# Patient Record
Sex: Male | Born: 1975 | Race: White | Hispanic: No | Marital: Married | Smoking: Former smoker
Health system: Southern US, Community
[De-identification: ages and names within clinical notes are randomized; demographics above are authoritative.]

---

## 2006-07-15 ENCOUNTER — Inpatient Hospital Stay (HOSPITAL_COMMUNITY): Admission: RE | Admit: 2006-07-15 | Discharge: 2006-07-18 | Payer: Self-pay | Admitting: Psychiatry

## 2006-07-16 ENCOUNTER — Ambulatory Visit: Payer: Self-pay | Admitting: Psychiatry

## 2015-08-20 ENCOUNTER — Emergency Department
Admission: EM | Admit: 2015-08-20 | Discharge: 2015-08-20 | Disposition: A | Discharge status: Home / Self Care | Payer: BLUE CROSS/BLUE SHIELD | Source: Home / Self Care | Attending: Family Medicine | Admitting: Family Medicine

## 2015-08-20 ENCOUNTER — Emergency Department (INDEPENDENT_AMBULATORY_CARE_PROVIDER_SITE_OTHER): Payer: BLUE CROSS/BLUE SHIELD

## 2015-08-20 ENCOUNTER — Encounter: Payer: Self-pay | Admitting: Emergency Medicine

## 2015-08-20 DIAGNOSIS — M25571 Pain in right ankle and joints of right foot: Secondary | ICD-10-CM | POA: Diagnosis not present

## 2015-08-20 DIAGNOSIS — M7671 Peroneal tendinitis, right leg: Secondary | ICD-10-CM | POA: Diagnosis not present

## 2015-08-20 MED ORDER — MELOXICAM 15 MG PO TABS: 15.0000 mg | ORAL_TABLET | Freq: Every day | ORAL | Status: AC

## 2015-08-20 NOTE — ED Provider Notes (Signed)
CSN: [id number]     Arrival date & time 08/20/15  1255 History   First MD Initiated Contact with Patient 08/20/15 1408     Chief Complaint  Patient presents with  . Foot Pain      HPI Comments: Patient complains of 4 week history of pain/swelling in the lateral aspect of his right ankle.  He recalls no injury, and no change in physical activities.  He initially had some bruising below his right lateral ankle.  He has mild discomfort in his ankle when walking.  Patient is a 39 y.o. male presenting with lower extremity pain. The history is provided by the patient.  Foot Pain This is a new problem. Episode onset: 4 weeks ago. The problem occurs constantly. The problem has not changed since onset.The symptoms are aggravated by walking. Nothing relieves the symptoms. Treatments tried: ice pack. The treatment provided no relief.    History reviewed. No pertinent past medical history. History reviewed. No pertinent past surgical history. No family history on file. Social History  Substance Use Topics  . Smoking status: None  . Smokeless tobacco: Current User    Types: Chew  . Alcohol Use: Yes    Review of Systems  All other systems reviewed and are negative.   Allergies  Review of patient's allergies indicates no known allergies.  Home Medications   Prior to Admission medications   Medication Sig Start Date End Date Taking? Authorizing Provider  albuterol (PROVENTIL) (2.5 MG/3ML) 0.083% nebulizer solution Take 2.5 mg by nebulization every 6 (six) hours as needed for wheezing or shortness of breath.   Yes Historical Provider, MD  budesonide-formoterol (SYMBICORT) 160-4.5 MCG/ACT inhaler Inhale 2 puffs into the lungs 2 (two) times daily.   Yes Historical Provider, MD  buPROPion (WELLBUTRIN XL) 300 MG 24 hr tablet Take 300 mg by mouth daily.   Yes Historical Provider, MD  cetirizine (ZYRTEC) 10 MG tablet Take 10 mg by mouth daily.   Yes Historical Provider, MD  lamoTRIgine (LAMICTAL)  100 MG tablet Take 100 mg by mouth daily.   Yes Historical Provider, MD  meloxicam (MOBIC) 15 MG tablet Take 1 tablet (15 mg total) by mouth daily. Take with food each morning 08/20/15   Lattie Haw, MD   Meds Ordered and Administered this Visit  Medications - No data to display  BP 136/90 mmHg  Pulse 94  Temp(Src) 98.4 F (36.9 C) (Oral)  Ht 6' (1.829 m)  Wt 264 lb (119.75 kg)  BMI 35.80 kg/m2  SpO2 96% No data found.   Physical Exam  Constitutional: He is oriented to person, place, and time. He appears well-developed and well-nourished. No distress.  Patient is obese (BMI 35.8)  HENT:  Head: Normocephalic.  Eyes: Pupils are equal, round, and reactive to light.  Pulmonary/Chest: No respiratory distress.  Musculoskeletal:       Right ankle: He exhibits normal range of motion, no swelling, no ecchymosis, no deformity and normal pulse. Tenderness. Lateral malleolus and medial malleolus tenderness found. No AITFL, no CF ligament, no posterior TFL, no head of 5th metatarsal and no proximal fibula tenderness found. Achilles tendon normal.       Feet:  There is tenderness over the course of the right peroneal tendon.  Pain is elicited with resisted eversion and resisted plantar flexion of the ankle.  Distal neurovascular function is intact.    There is also mild tenderness to palpation over the right posterior tibial tendon extending into arch.  Pain elicited  by resisted plantar flexion and resisted inversion of ankle.   There is some subtle hyperpigmentation of the lateral aspect of the right foot as noted on diagram.    Neurological: He is alert and oriented to person, place, and time.  Skin: Skin is warm and dry. No erythema.  Nursing note and vitals reviewed.   ED Course  Procedures  none  Imaging Review Dg Ankle Complete Right  08/20/2015   CLINICAL DATA:  Persistent lateral right ankle pain for 1 month. No injury.  EXAM: RIGHT ANKLE - COMPLETE 3+ VIEW  COMPARISON:   None.  FINDINGS: There is no evidence of fracture, dislocation, or joint effusion. Soft tissues are unremarkable.  IMPRESSION: No acute fracture or dislocation.   Electronically Signed   By: Sherian Rein M.D.   On: 08/20/2015 14:38          MDM   1. Peroneal tendonitis, right; possibly mild posterior tibial tendonitis as well.    AirCast stirrup splint dispensed.  Begin Mobic 15mg  daily. Apply ice pack for 15 to 20 minutes, 3 to 4 times daily until pain decreases.  Wear stirrup splint daytime.  Begin ankle exercises as tolerated. Followup with Dr. Rodney Langton or Dr. Clementeen [facility] ([facility]) if not improving about two weeks.     Lattie Haw, MD 08/20/15 1455

## 2015-08-20 NOTE — ED Notes (Signed)
Right foot pain x 3-4 weeks, can't think of any specific injury

## 2015-08-20 NOTE — Discharge Instructions (Signed)
Apply ice pack for 15 to 20 minutes, 3 to 4 times daily until pain decreases.  Wear stirrup splint daytime.  Begin ankle exercises as tolerated.

## 2015-08-23 ENCOUNTER — Telehealth: Payer: Self-pay

## 2016-10-14 IMAGING — CR DG ANKLE COMPLETE 3+V*R*
3 series · 3 of 3 positions shown · non-contrast
Comparison: None.

CLINICAL DATA: Persistent lateral right ankle pain for 1 month. No
injury.

EXAM:
RIGHT ANKLE - COMPLETE 3+ VIEW

[ankle ap]
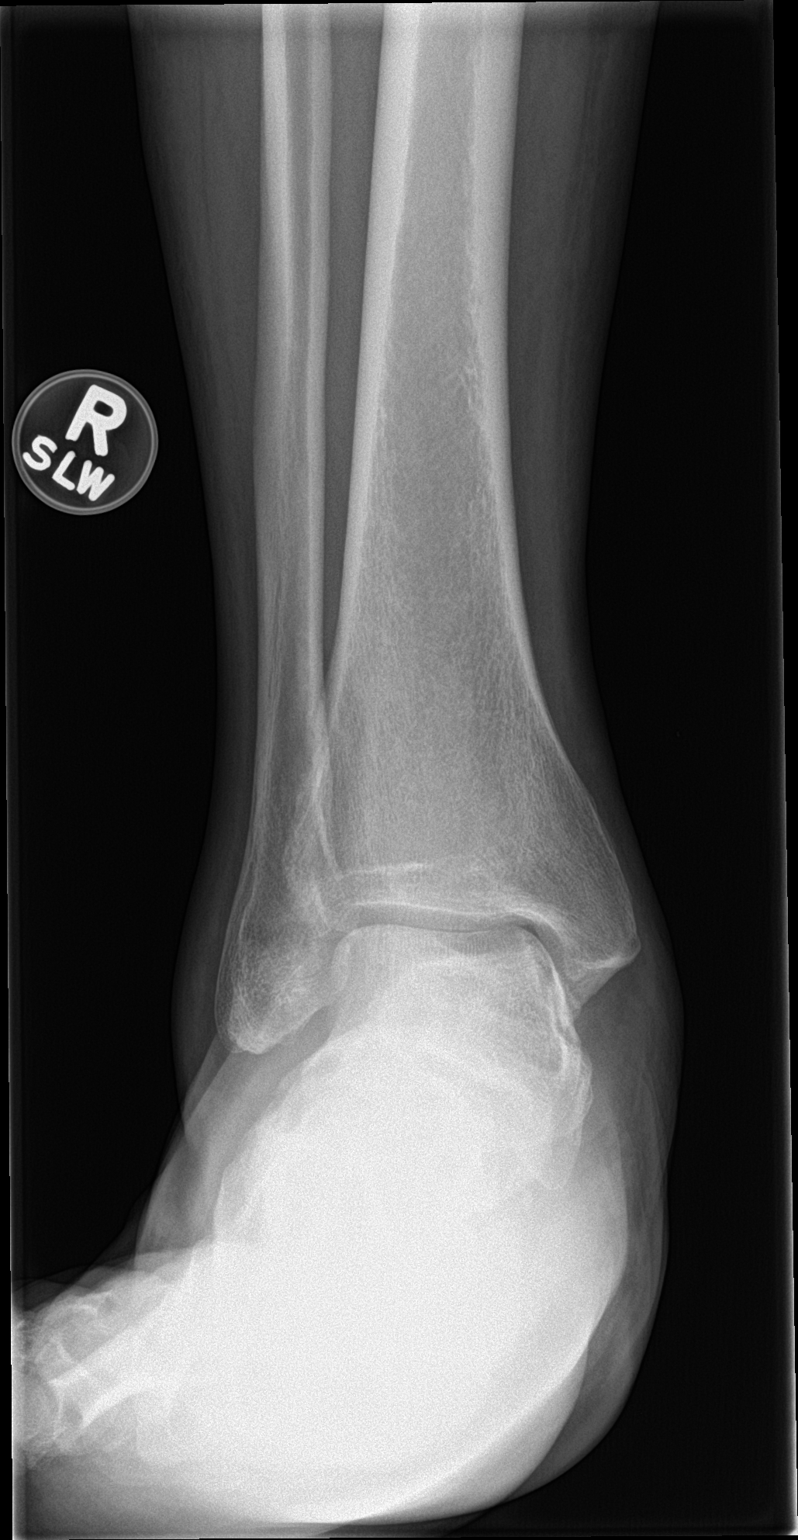

[ankle obl]
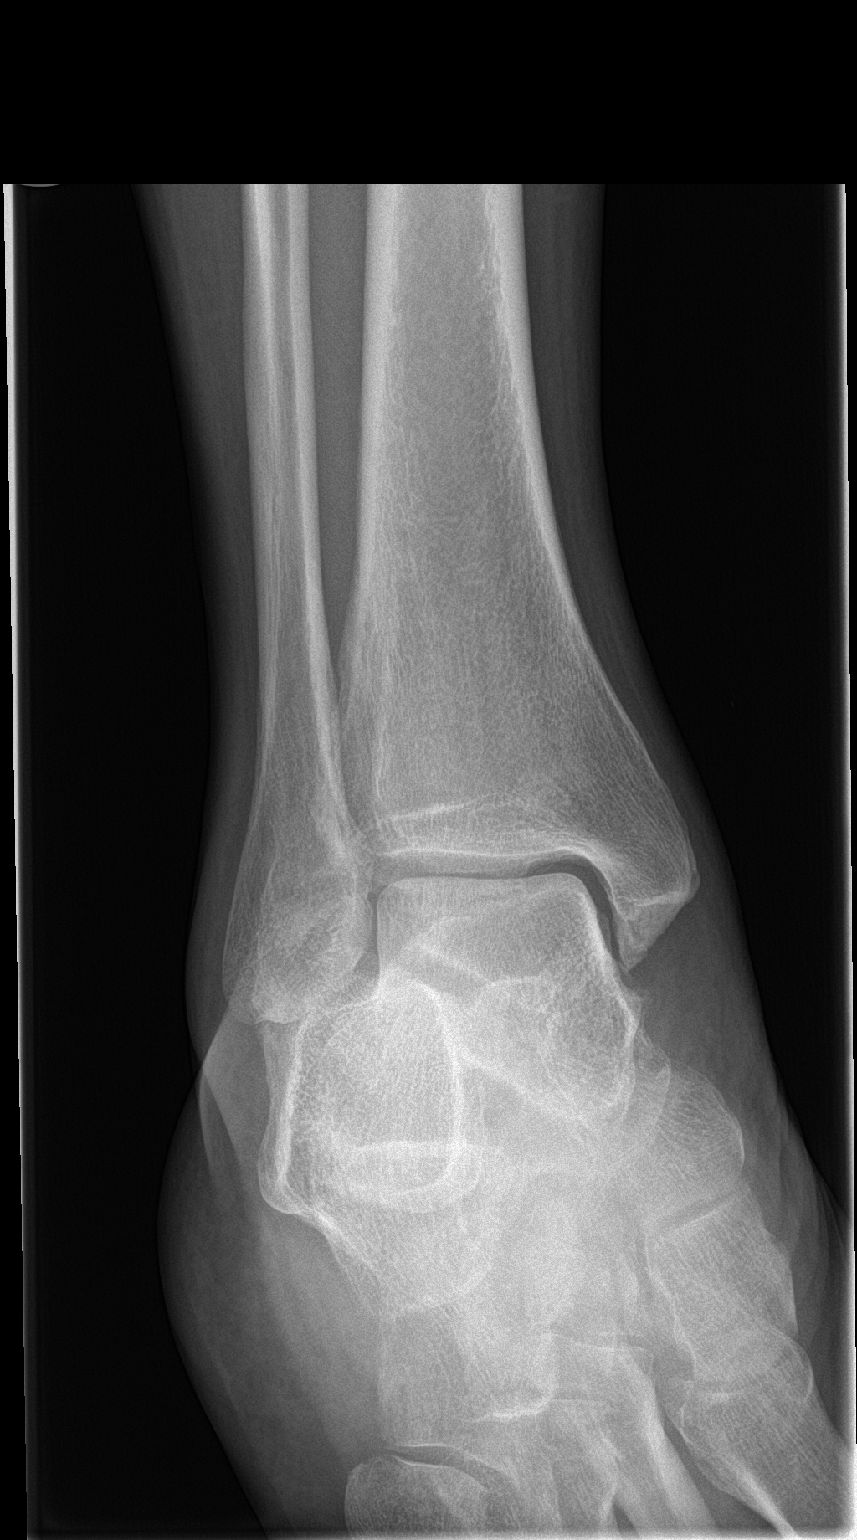

[ankle lat]
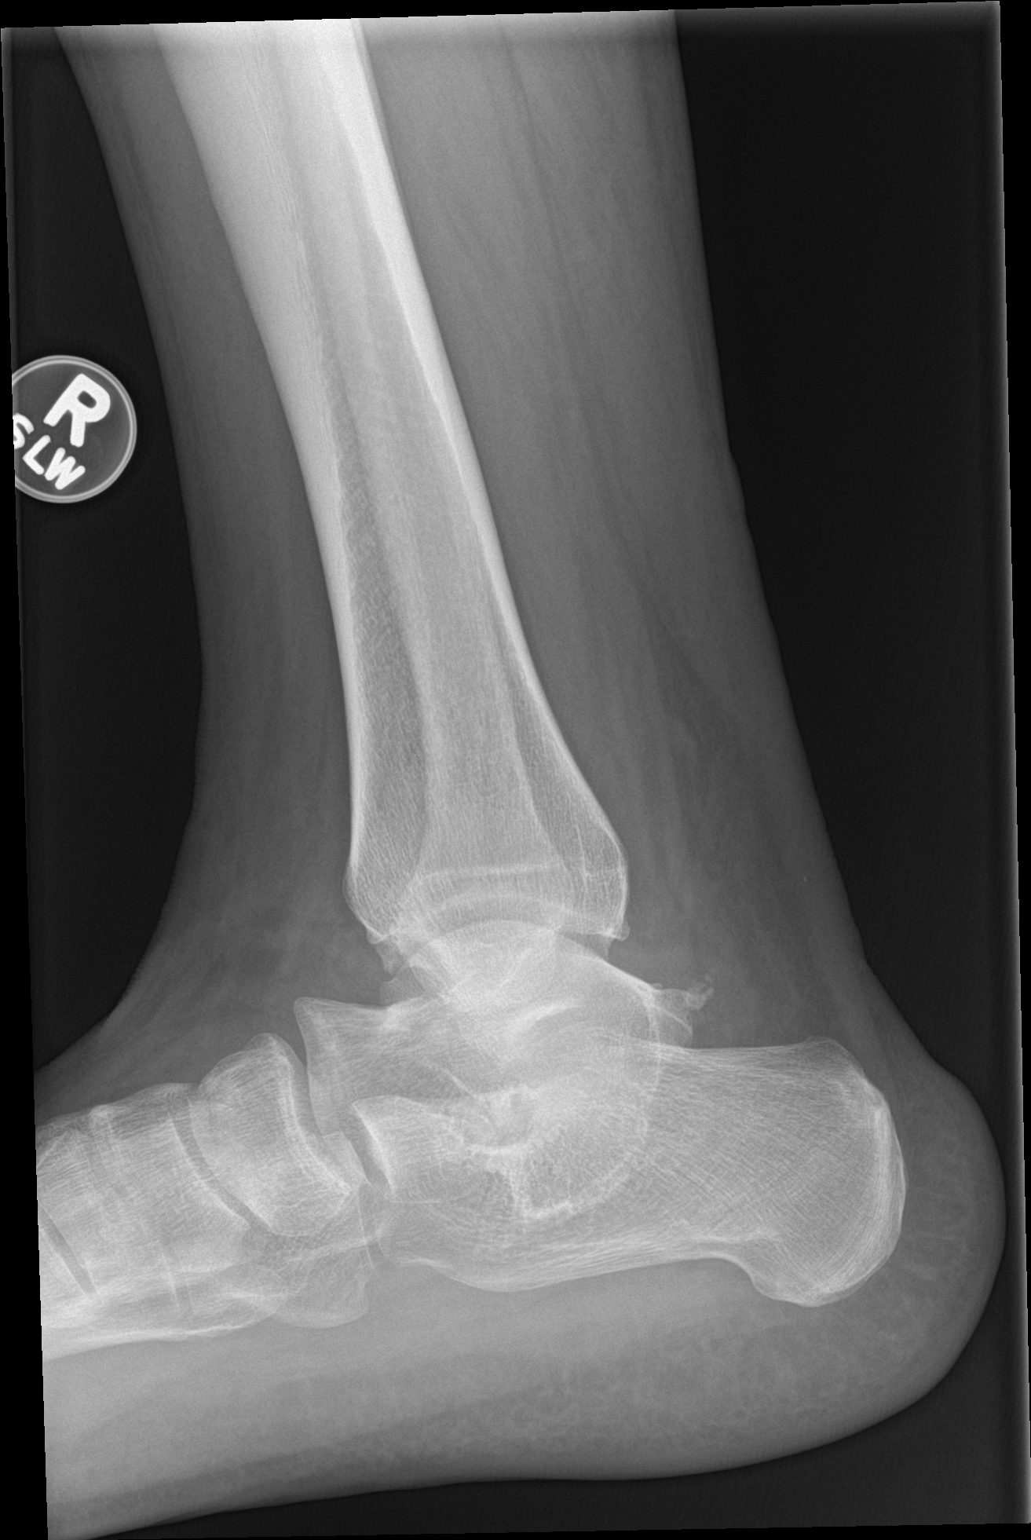

[3 of 3 positions shown; findings below may reference images not displayed]

FINDINGS: There is no evidence of fracture, dislocation, or joint effusion.
Soft tissues are unremarkable.
IMPRESSION: No acute fracture or dislocation.

## 2020-03-05 ENCOUNTER — Emergency Department (INDEPENDENT_AMBULATORY_CARE_PROVIDER_SITE_OTHER)
Admission: EM | Admit: 2020-03-05 | Discharge: 2020-03-05 | Disposition: A | Discharge status: Home / Self Care | Payer: BLUE CROSS/BLUE SHIELD | Source: Home / Self Care

## 2020-03-05 ENCOUNTER — Other Ambulatory Visit: Payer: Self-pay

## 2020-03-05 DIAGNOSIS — R079 Chest pain, unspecified: Secondary | ICD-10-CM

## 2020-03-05 DIAGNOSIS — R000 Tachycardia, unspecified: Secondary | ICD-10-CM

## 2020-03-05 MED ORDER — ASPIRIN 81 MG PO CHEW
324.0000 mg | CHEWABLE_TABLET | Freq: Once | ORAL | Status: AC
  Administered 2020-03-05: 324 mg via ORAL

## 2020-03-05 NOTE — ED Triage Notes (Signed)
Patient presents to Urgent Care with complaints of chest pain since earlier this morning. Patient reports he has been using a Bipap machine recently for his sleep apnea. Pt states yesterday he started feeling odd, was not very active yesterday. Pt states the weird/hot feeling began in the car today, has chest pressure, states his fitbit recorded a HR in the 150s and it has been in the 120s since then. Pt called his PCP and they suggested he come be evaluated. Pt's HR 114 upon arrival. Pt's CP is left sided, pressure, non-radiating and constant. Pt has been taking medications as directed.

## 2020-03-05 NOTE — ED Provider Notes (Signed)
[facility]    CSN: [id number] Arrival date & time: 03/05/20  1027      History   Chief Complaint Chief Complaint  Patient presents with  . Chest Pain    HPI Sean Blankenship is a 44 y.o. male.   HPI Sean Blankenship is a 43 y.o. male presenting to UC with c/o centralized chest pain that started this morning. Pt states pain is mild but describes as "a small child sitting on my chest," non-radiating, constant.  He recently started using a Bipap machine for his sleep apnea and started to feel odd yesterday so he rested hoping he would feel better today.  Today he felt the chest pressure, a hot sensation throughout his body, weakness, and his fitbit and BP machine at home showed his HR in the 150s.  Pt reports similar HR when he checked his pulse manually. He called his PCP who recommended he be evaluated today. He recalls similar incident of his heart racing a few years ago. He was admitted at [facility] and evaluated by a cardiologist. He was placed on medication to help control his heart but was eventually advised he no longer needed the medication. No hx of MI. No recent medication changes.    History reviewed. No pertinent past medical history.  There are no problems to display for this patient.   History reviewed. No pertinent surgical history.     Home Medications    Prior to Admission medications   Medication Sig Start Date End Date Taking? Authorizing Provider  albuterol (PROVENTIL) (2.5 MG/3ML) 0.083% nebulizer solution Take 2.5 mg by nebulization every 6 (six) hours as needed for wheezing or shortness of breath.    [provider]  budesonide-formoterol (SYMBICORT) 160-4.5 MCG/ACT inhaler Inhale 2 puffs into the lungs 2 (two) times daily.    [provider]  buPROPion (WELLBUTRIN XL) 300 MG 24 hr tablet Take 300 mg by mouth daily.    [provider]  cetirizine (ZYRTEC) 10 MG tablet Take 10 mg by mouth daily.    [provider]  lamoTRIgine (LAMICTAL) 100 MG tablet Take 100 mg by mouth daily.    [provider]  lisinopril-hydrochlorothiazide (ZESTORETIC) 20-12.5 MG tablet Take by mouth.    [provider]  meloxicam (MOBIC) 15 MG tablet Take 1 tablet (15 mg total) by mouth daily. Take with food each morning 08/20/15   Kandra Nicolas, MD    Family History Family History  Problem Relation Age of Onset  . Cancer Mother   . Hypertension Father     Social History Social History   Tobacco Use  . Smoking status: Former Research scientist (life sciences)  . Smokeless tobacco: Former Systems developer    Types: Chew  Substance Use Topics  . Alcohol use: Yes    Comment: occ  . Drug use: Not on file     Allergies   Patient has no known allergies.   Review of Systems Review of Systems  Constitutional: Negative for chills, diaphoresis and fever.  Respiratory: Negative for cough and shortness of breath.   Cardiovascular: Positive for chest pain and palpitations.  Gastrointestinal: Negative for abdominal pain, diarrhea, nausea and vomiting.  Musculoskeletal: Negative for arthralgias, back pain and myalgias.  Skin: Negative for rash.  Neurological: Positive for weakness (generalized). Negative for dizziness, light-headedness and headaches.  All other systems reviewed and are negative.    Physical Exam Triage Vital Signs ED Triage Vitals  Enc Vitals Group  BP 03/05/20 1050 (!) 151/89     Pulse Rate 03/05/20 1050 (!) 114     Resp 03/05/20 1050 18     Temp 03/05/20 1050 98.9 F (37.2 C)     Temp Source 03/05/20 1050 Oral     SpO2 03/05/20 1050 100 %     Weight 03/05/20 1047 275 lb (124.7 kg)     Height 03/05/20 1047 6' (1.829 m)     Head Circumference --      Peak Flow --      Pain Score 03/05/20 1047 3     Pain Loc --      Pain Edu? --      Excl. in GC? --    No data found.  Updated Vital Signs BP (!) 151/89 (BP Location: Right Arm)   Pulse (!) 114   Temp 98.9 F (37.2 C) (Oral)   Resp 18    Ht 6' (1.829 m)   Wt 275 lb (124.7 kg)   SpO2 100%   BMI 37.30 kg/m   Visual Acuity Right Eye Distance:   Left Eye Distance:   Bilateral Distance:    Right Eye Near:   Left Eye Near:    Bilateral Near:     Physical Exam Vitals and nursing note reviewed.  Constitutional:      General: He is not in acute distress.    Appearance: He is well-developed. He is obese. He is not ill-appearing, toxic-appearing or diaphoretic.  HENT:     Head: Normocephalic and atraumatic.  Cardiovascular:     Rate and Rhythm: Regular rhythm. Tachycardia present.     Heart sounds: Normal heart sounds.     Comments: Mild tachycardia, regular rhythm Pulmonary:     Effort: Pulmonary effort is normal.     Breath sounds: No decreased breath sounds, wheezing, rhonchi or rales.  Musculoskeletal:        General: Normal range of motion.     Cervical back: Normal range of motion.  Skin:    General: Skin is warm and dry.  Neurological:     Mental Status: He is alert and oriented to person, place, and time.  Psychiatric:        Behavior: Behavior normal.      UC Treatments / Results  Labs (all labs ordered are listed, but only abnormal results are displayed) Labs Reviewed - No data to display  EKG Date/Time:03/05/2020   11:00:39 Ventricular Rate: 107 PR Interval: 154 QRS Duration: 100 QT Interval: 318 QTC Calculation: 424 P-R-T axes: 50   -25    26 Text Interpretation: Sinus tachycardia, possible Left atrial enlargement, borderline ECG No prior ECG to compare.    Radiology No results found.  Procedures Procedures (including critical care time)  Medications Ordered in UC Medications  aspirin chewable tablet 324 mg (324 mg Oral Given 03/05/20 1117)    Initial Impression / Assessment and Plan / UC Course  I have reviewed the triage vital signs and the nursing notes.  Pertinent labs & imaging results that were available during my care of the patient were reviewed by me and  considered in my medical decision making (see chart for details).     Although no STEMI noted on ECG, due to patient's reported hx of arrhythmias and need for medication as well as current c/o chest pain, weakness, and tachycardia, recommend further cardia workup in emergency department. Pt declined EMS transport but is agreeable to drive to [facility] Emergency Department. Pt given 324mg  aspirin  prior to discharge.  AVS provided  Final Clinical Impressions(s) / UC Diagnoses   Final diagnoses:  Chest pain, unspecified type  Tachycardia     Discharge Instructions      You have declined EMS transport, however, due to your symptoms of continued chest pain, weakness/fatigue, and elevated heart rate, it is recommended you drive yourself directly to the emergency department for further evaluation and treatment. Please let them know we gave you 324mg  aspirin around 11:15AM today.     ED Prescriptions    None     PDMP not reviewed this encounter.   , Lurene Shadow 03/05/20 1135

## 2020-03-05 NOTE — ED Notes (Signed)
Patient is being discharged from the Urgent Care Center and sent to the Emergency Department via POV. Per Waylan Rocher, patient is stable but in need of higher level of care due to chest pressure and history of afib. Patient is aware and verbalizes understanding of plan of care.  Vitals:   03/05/20 1050  BP: (!) 151/89  Pulse: (!) 114  Resp: 18  Temp: 98.9 F (37.2 C)  SpO2: 100%

## 2020-03-05 NOTE — Discharge Instructions (Signed)
  You have declined EMS transport, however, due to your symptoms of continued chest pain, weakness/fatigue, and elevated heart rate, it is recommended you drive yourself directly to the emergency department for further evaluation and treatment. Please let them know we gave you 324mg  aspirin around 11:15AM today.
